# Patient Record
Sex: Female | Born: 1975
Health system: Southern US, Community
[De-identification: ages and names within clinical notes are randomized; demographics above are authoritative.]

## PROBLEM LIST (undated history)

## (undated) DIAGNOSIS — F419 Anxiety disorder, unspecified: Secondary | ICD-10-CM

## (undated) DIAGNOSIS — F329 Major depressive disorder, single episode, unspecified: Secondary | ICD-10-CM

## (undated) DIAGNOSIS — F32A Depression, unspecified: Secondary | ICD-10-CM

## (undated) DIAGNOSIS — D649 Anemia, unspecified: Secondary | ICD-10-CM

## (undated) HISTORY — DX: Anxiety disorder, unspecified: F41.9

## (undated) HISTORY — PX: TUBAL LIGATION: SHX77

## (undated) HISTORY — DX: Major depressive disorder, single episode, unspecified: F32.9

## (undated) HISTORY — DX: Depression, unspecified: F32.A

## (undated) HISTORY — DX: Anemia, unspecified: D64.9

---

## 2009-12-19 ENCOUNTER — Emergency Department (HOSPITAL_BASED_OUTPATIENT_CLINIC_OR_DEPARTMENT_OTHER): Admission: EM | Admit: 2009-12-19 | Discharge: 2009-12-19 | Payer: Self-pay | Admitting: Emergency Medicine

## 2010-04-26 LAB — URINE MICROSCOPIC-ADD ON

## 2010-04-26 LAB — CBC
HCT: 35 % — ABNORMAL LOW (ref 36.0–46.0)
Hemoglobin: 11.7 g/dL — ABNORMAL LOW (ref 12.0–15.0)
MCH: 30.5 pg (ref 26.0–34.0)
MCHC: 33.4 g/dL (ref 30.0–36.0)
RDW: 12.2 % (ref 11.5–15.5)

## 2010-04-26 LAB — URINALYSIS, ROUTINE W REFLEX MICROSCOPIC
Bilirubin Urine: NEGATIVE
Ketones, ur: 15 mg/dL — AB
Leukocytes, UA: NEGATIVE
Nitrite: NEGATIVE
Protein, ur: 30 mg/dL — AB

## 2010-04-26 LAB — COMPREHENSIVE METABOLIC PANEL
ALT: 8 U/L (ref 0–35)
AST: 29 U/L (ref 0–37)
Alkaline Phosphatase: 91 U/L (ref 39–117)
CO2: 22 mEq/L (ref 19–32)
Calcium: 9.7 mg/dL (ref 8.4–10.5)
GFR calc Af Amer: >60 mL/min (ref >60)
Glucose, Bld: 77 mg/dL (ref 70–99)
Potassium: 4.1 mEq/L (ref 3.5–5.1)
Sodium: 143 mEq/L (ref 135–145)
Total Protein: 8.7 g/dL — ABNORMAL HIGH (ref 6.0–8.3)

## 2010-04-26 LAB — LIPASE, BLOOD: Lipase: 24 U/L (ref 23–300)

## 2010-04-26 LAB — DIFFERENTIAL
Basophils Absolute: 0.1 10*3/uL (ref 0.0–0.1)
Basophils Relative: 1 % (ref 0–1)
Eosinophils Absolute: 0 10*3/uL (ref 0.0–0.7)
Eosinophils Relative: 0 % (ref 0–5)
Monocytes Absolute: 0.4 10*3/uL (ref 0.1–1.0)
Monocytes Relative: 4 % (ref 3–12)

## 2010-04-26 LAB — PREGNANCY, URINE: Preg Test, Ur: NEGATIVE

## 2011-05-25 ENCOUNTER — Other Ambulatory Visit (HOSPITAL_COMMUNITY)
Admission: RE | Admit: 2011-05-25 | Discharge: 2011-05-25 | Disposition: A | Discharge status: Home / Self Care | Payer: BC Managed Care – PPO | Source: Ambulatory Visit | Attending: Family Medicine | Admitting: Family Medicine

## 2011-05-25 ENCOUNTER — Other Ambulatory Visit: Payer: Self-pay

## 2011-05-25 DIAGNOSIS — Z01419 Encounter for gynecological examination (general) (routine) without abnormal findings: Secondary | ICD-10-CM | POA: Insufficient documentation

## 2011-10-31 ENCOUNTER — Other Ambulatory Visit: Payer: Self-pay | Admitting: Family Medicine

## 2011-10-31 DIAGNOSIS — E010 Iodine-deficiency related diffuse (endemic) goiter: Secondary | ICD-10-CM

## 2011-11-03 ENCOUNTER — Ambulatory Visit
Admission: RE | Admit: 2011-11-03 | Discharge: 2011-11-03 | Disposition: A | Discharge status: Home / Self Care | Payer: BC Managed Care – PPO | Source: Ambulatory Visit | Attending: Family Medicine | Admitting: Family Medicine

## 2011-11-03 DIAGNOSIS — E010 Iodine-deficiency related diffuse (endemic) goiter: Secondary | ICD-10-CM

## 2011-11-06 ENCOUNTER — Other Ambulatory Visit: Payer: BC Managed Care – PPO

## 2013-06-09 ENCOUNTER — Encounter: Payer: Self-pay | Admitting: *Deleted

## 2013-06-10 ENCOUNTER — Encounter (INDEPENDENT_AMBULATORY_CARE_PROVIDER_SITE_OTHER): Payer: Self-pay

## 2013-06-10 ENCOUNTER — Encounter: Payer: Self-pay | Admitting: Neurology

## 2013-06-10 ENCOUNTER — Ambulatory Visit (INDEPENDENT_AMBULATORY_CARE_PROVIDER_SITE_OTHER): Payer: BC Managed Care – PPO | Admitting: Neurology

## 2013-06-10 VITALS — BP 117/74 | HR 80 | Ht 65.75 in | Wt 231.0 lb

## 2013-06-10 DIAGNOSIS — R519 Headache, unspecified: Secondary | ICD-10-CM

## 2013-06-10 DIAGNOSIS — R51 Headache: Secondary | ICD-10-CM

## 2013-06-10 DIAGNOSIS — G43909 Migraine, unspecified, not intractable, without status migrainosus: Secondary | ICD-10-CM

## 2013-06-10 MED ORDER — AMITRIPTYLINE HCL 25 MG PO TABS: 12.5000 mg | ORAL_TABLET | Freq: Every day | ORAL | Status: DC

## 2013-06-10 NOTE — Patient Instructions (Signed)
Overall you are doing fairly well but I do want to suggest a few things today:   Remember to drink plenty of fluid, eat healthy meals and do not skip any meals. Try to eat protein with a every meal and eat a healthy snack such as fruit or nuts in between meals. Try to keep a regular sleep-wake schedule and try to exercise daily, particularly in the form of walking, 20-30 minutes a day, if you can.   As far as your medications are concerned, I would like to suggest the following: 1)please start taking Elavil 12.5mg  nightly (1/2 tablet). The main side effect is fatigue and dry mouth  I would like to see you back in 4 months, sooner if we need to. Please call us with any interim questions, concerns, problems, updates or refill requests.   My clinical assistant and will answer any of your questions and relay your messages to me and also relay most of my messages to you.   Our phone number is [phone]. We also have an after hours call service for urgent matters and there is a physician on-call for urgent questions. For any emergencies you know to call 911 or go to the nearest emergency room

## 2013-06-10 NOTE — Progress Notes (Signed)
[facility]    Provider:  Dr Hosie PoissonSumner Referring Provider: Cain SaupeFulp, Cammie, MD Primary Care Physician:  Cain SaupeFULP, CAMMIE, MD  CC:  headaches  HPI:  Angel Mueller is a 38 y.o. female here as a referral from Dr. Jillyn HiddenFulp for evaluation of chronic headaches  Reports chronic headache history, occuring near daily. Has had headaches since childhood. Can last 6 hours to all day if severe. Typically on the left temporal region, behind the eye. Described as a squeezing, pulsating pain. At its worst can get up to a 10/10, typically a 6/10 on a daily basis. Will have 4-6 severe exacerbations a month. + Nausea, no emesis, + photo and phonophobia. No vision change or vision loss, no transient obscurations. No focal motor or sensory changes. Notes some light headed sensations with the headache. Can triggered by humidity, cold cuts, red wine, tomatoes. Has tried Imitrex, bultabital in the past with no benefit. Currently taking Maxalt as needed and topamax, but unable to tolerate topamax due to side effects of paresthesias so she stopped it. In the past has tried nasal imitrex. Has not been on any other daily agents. Otherwise healthy. Has some difficulty sleeping. No known snoring or apnea events.   Strong family history of headaches.   Review of Systems: Out of a complete 14 system review, the patient complains of only the following symptoms, and all other reviewed systems are negative. + headaches, numbness, anxiety, fatigue  History   Social History  . Marital Status: Married    Spouse Name: N/A    Number of Children: 1  . Years of Education: Masters   Occupational History  .     Social History Main Topics  . Smoking status: Never Smoker   . Smokeless tobacco: Not on file  . Alcohol Use: Yes     Comment: 1-2 per week occ, wine  . Drug Use: No  . Sexual Activity: Not on file   Other Topics Concern  . Not on file   Social History Narrative   Married, 1 child   Right handed   Masters degree   No caffeine    Family History  Problem Relation Age of Onset  . Cancer - Other Maternal Aunt   . Ovarian cancer Maternal Grandmother   . Cancer Maternal Grandfather   . Prostate cancer Paternal Grandfather     No past medical history on file.  No past surgical history on file.  Current Outpatient Prescriptions  Medication Sig Dispense Refill  . ibuprofen (ADVIL,MOTRIN) 800 MG tablet Take 800 mg by mouth every 8 (eight) hours as needed.      . loratadine (CLARITIN) 10 MG tablet Take 10 mg by mouth daily.      . medroxyPROGESTERone (PROVERA) 10 MG tablet Take 10 mg by mouth daily.      . rizatriptan (MAXALT-MLT) 10 MG disintegrating tablet       . valACYclovir (VALTREX) 500 MG tablet Take 500 mg by mouth 2 (two) times daily.       No current facility-administered medications for this visit.    Allergies as of 06/10/2013 - Review Complete 06/10/2013  Allergen Reaction Noted  . Sulfa antibiotics Anaphylaxis 06/09/2013    Vitals: BP 117/74  Pulse 80  Ht 5' 5.75" (1.67 m)  Wt 231 lb (104.781 kg)  BMI 37.57 kg/m2 Last Weight:  Wt Readings from Last 1 Encounters:  06/10/13 231 lb (104.781 kg)   Last Height:   Ht Readings from Last 1 Encounters:  06/10/13  5' 5.75" (1.67 m)     Physical exam: Exam: Gen: NAD, conversant Eyes: anicteric sclerae, moist conjunctivae HENT: Atraumatic, oropharynx clear Neck: Trachea midline; supple,  Lungs: CTA, no wheezing, rales, rhonic                          CV: RRR, no MRG Abdomen: Soft, non-tender;  Extremities: No peripheral edema  Skin: Normal temperature, no rash,  Psych: Appropriate affect, pleasant  Neuro: MS: AA&Ox3, appropriately interactive, normal affect   Attention: WORLD backwards  Speech: fluent w/o paraphasic error  Memory: good recent and remote recall  CN: PERRL, EOMI no nystagmus, fundoscopic exam wnl, VFF to FC bilat, no ptosis, sensation intact to LT V1-V3 bilat, face symmetric, no  weakness, hearing grossly intact, palate elevates symmetrically, shoulder shrug 5/5 bilat,  tongue protrudes midline, no fasiculations noted.  Motor: normal bulk and tone Strength: 5/5  In all extremities  Coord: rapid alternating and point-to-point (FNF, HTS) movements intact.  Reflexes: symmetrical, bilat downgoing toes  Sens: LT intact in all extremities  Gait: posture, stance, stride and arm-swing normal. Tandem gait intact. Able to walk on heels and toes. Romberg absent.   Assessment:  After physical and neurologic examination, review of laboratory studies, imaging, neurophysiology testing and pre-existing records, assessment will be reviewed on the problem list.  Plan:  Treatment plan and additional workup will be reviewed under Problem List.  1)Migraine headache  37y/o woman presenting for initial evaluation of chronic headache which based on history is most consistent with a diagnosis of migraine. She has tried Topamax in the past but was unable to tolerate. Currently taking Rizatriptan as needed for headache relief. Will try daily treatment with Elavil 12.5mg  nightly, counseled patient on potential side effects. If no improvement can consider increasing the dose. Would consider Botox therapy for chronic migraine in the future.   Elspeth ChoPeter Brason Berthelot, DO  [facility] [address] [place], [place] [id number]  Phone [phone] Fax [phone]

## 2013-10-09 ENCOUNTER — Ambulatory Visit: Payer: BC Managed Care – PPO | Admitting: Neurology

## 2013-12-12 ENCOUNTER — Telehealth: Payer: Self-pay | Admitting: Neurology

## 2013-12-12 NOTE — Telephone Encounter (Signed)
Attempted to call patient regarding rescheduling 10/09/13 appointment, patient is former Dr. Hosie PoissonSumner patient. No voicemail option on contact number.

## 2017-01-12 DIAGNOSIS — R252 Cramp and spasm: Secondary | ICD-10-CM | POA: Diagnosis not present

## 2017-07-20 LAB — BASIC METABOLIC PANEL
Creatinine: 0.7 (ref 0.5–1.1)
GLUCOSE: 93

## 2017-07-20 LAB — HEMOGLOBIN A1C: Hemoglobin A1C: 4.9

## 2017-07-20 LAB — LIPID PANEL
CHOLESTEROL: 147 (ref 0–200)
HDL: 73 — AB (ref 35–70)
LDL CALC: 63
Triglycerides: 54 (ref 40–160)

## 2017-09-19 ENCOUNTER — Encounter: Payer: Self-pay | Admitting: Family Medicine

## 2017-09-19 ENCOUNTER — Other Ambulatory Visit: Payer: Self-pay

## 2017-09-19 ENCOUNTER — Ambulatory Visit: Payer: BLUE CROSS/BLUE SHIELD | Admitting: Family Medicine

## 2017-09-19 VITALS — BP 123/83 | HR 76 | Temp 98.5°F | Resp 17 | Ht 65.75 in | Wt 237.4 lb

## 2017-09-19 DIAGNOSIS — E6609 Other obesity due to excess calories: Secondary | ICD-10-CM | POA: Diagnosis not present

## 2017-09-19 DIAGNOSIS — Z6838 Body mass index (BMI) 38.0-38.9, adult: Secondary | ICD-10-CM | POA: Diagnosis not present

## 2017-09-19 DIAGNOSIS — G43709 Chronic migraine without aura, not intractable, without status migrainosus: Secondary | ICD-10-CM | POA: Diagnosis not present

## 2017-09-19 NOTE — Progress Notes (Signed)
Chief Complaint  Patient presents with  . New Patient (Initial Visit)    establish care, no concern, per pt bloodwork (cbc and cholest) checked for work on last month    HPI  She is here to established care.  She has a history of migraines, anxiety and depression and irregular periods She stats that she is not currently on any medicaitons Her Primary Care Doctor left practice and she neeed a new provider She states that she now uses ibuprofen for her migraines  She reports that she gets migraines 1-2 times a week and last 1-2 days She states that is she can take the ibuprofen 800mg  early enough they are better  She does not get aura but gets nausea [name] was 08/01/2017.   Anxiety and depression She is not on any medications for depression or anxiety at this time Depression screen [facility] 2/9 09/19/2017  Decreased Interest 0  Down, Depressed, Hopeless 0  PHQ - 2 Score 0   Obesity She reports that she is plateua in her weight She is not from [place] so since moving here she gained but has stabilized her weight She is not exercising She eats 3 meals a day and has healthy snacks She is sedentary    Past Medical History:  Diagnosis Date  . Anemia   . Anxiety   . Depression     Current Outpatient Medications  Medication Sig Dispense Refill  . ibuprofen (ADVIL,MOTRIN) 800 MG tablet Take 800 mg by mouth every 8 (eight) hours as needed.     No current facility-administered medications for this visit.     Allergies:  Allergies  Allergen Reactions  . Sulfa Antibiotics Anaphylaxis    History reviewed. No pertinent surgical history.  Social History   Socioeconomic History  . Marital status: Married    Spouse name: Not on file  . Number of children: 1  . Years of education: Masters  . Highest education level: Not on file  Occupational History    Employer: [employer]  Social Needs  . Financial resource strain: Not on file  . Food insecurity:     Worry: Not on file    Inability: Not on file  . Transportation needs:    Medical: Not on file    Non-medical: Not on file  Tobacco Use  . Smoking status: Never Smoker  . Smokeless tobacco: Never Used  Substance and Sexual Activity  . Alcohol use: Yes    Comment: 1-2 per week occ, wine  . Drug use: No  . Sexual activity: Not on file  Lifestyle  . Physical activity:    Days per week: Not on file    Minutes per session: Not on file  . Stress: Not on file  Relationships  . Social connections:    Talks on phone: Not on file    Gets together: Not on file    Attends religious service: Not on file    Active member of club or organization: Not on file    Attends meetings of clubs or organizations: Not on file    Relationship status: Not on file  Other Topics Concern  . Not on file  Social History Narrative   Married, 1 child   Right handed   Masters degree   No caffeine    Family History  Problem Relation Age of Onset  . Heart disease Father   . Prostate cancer Paternal Grandfather   . Cancer Maternal Grandfather   .  Ovarian cancer Maternal Grandmother   . Cancer - Other Maternal Aunt      ROS Review of Systems See HPI Constitution: No fevers or chills No malaise No diaphoresis Skin: No rash or itching Eyes: no blurry vision, no double vision GU: no dysuria or hematuria Neuro: no dizziness or headaches all others reviewed and negative   Objective: Vitals:   09/19/17 1540  BP: 123/83  Pulse: 76  Resp: 17  Temp: 98.5 F (36.9 C)  TempSrc: Oral  SpO2: 99%  Weight: 237 lb 6.4 oz (107.7 kg)  Height: 5' 5.75" (1.67 m)  Body mass index is 38.61 kg/m.   Physical Exam  Constitutional: She is oriented to [place], place, and time. She appears well-developed and well-nourished.  HENT:  Head: Normocephalic and atraumatic.  Eyes: Conjunctivae and EOM are normal.  Cardiovascular: Normal rate, regular rhythm and normal heart sounds.  No murmur  heard. Pulmonary/Chest: Effort normal and breath sounds normal. No stridor. No respiratory distress.  Neurological: She is alert and oriented to [place], place, and time.  Skin: Skin is warm. Capillary refill takes less than 2 seconds.  Psychiatric: She has a normal mood and affect. Her behavior is normal. Judgment and thought content normal.    Assessment and Plan Hilda LiasMarie was seen today for new patient (initial visit).  Diagnoses and all orders for this visit:  Class 2 obesity due to excess calories without serious comorbidity with body mass index (BMI) of 38.0 to 38.9 in adult For her obesity will check thyroid function, diabetes and lipid Will review [facility] biometric results Discussed exercise strategies   Chronic migraine without aura without status migrainosus, not intractable Chronic Fatigue Will check magnesium, vitamin D, b12, iron panel and cbc for fatigue symptoms Will also screen for sleep apnea   Kylah Maresh A Creta LevinStallings

## 2017-09-19 NOTE — Patient Instructions (Addendum)
IF you received an x-ray today, you will receive an invoice from [facility]. Please contact [facility] at [phone] with questions or concerns regarding your invoice.   IF you received labwork today, you will receive an invoice from [facility]. Please contact [facility] at [phone] with questions or concerns regarding your invoice.   Our billing staff will not be able to assist you with questions regarding bills from these companies.  You will be contacted with the lab results as soon as they are available. The fastest way to get your results is to activate your My Chart account. Instructions are located on the last page of this paperwork. If you have not heard from us regarding the results in 2 weeks, please contact this office.   We recommend that you schedule a mammogram for breast cancer screening. Typically, you do not need a referral to do this. Please contact a local imaging center to schedule your mammogram.  [facility] - [phone]) [phone]  *ask for the Radiology Department [facility]) - [phone] or [phone]) [phone]  [facility] - [phone] [facility] - [phone]) [phone] [facility] - [phone]) [phone]  *ask for the Radiology Department [facility] - [phone]  *ask for the Radiology Department [facility] - [phone]) [phone]  *ask for the Mammography Department [facility] - [phone]) [phone]  Exercising to Lose Weight Exercising can help you to lose weight. In order to lose weight through exercise, you need to do vigorous-intensity exercise. You can tell that you are exercising with vigorous intensity if you are breathing very hard and fast and cannot hold a conversation while exercising. Moderate-intensity exercise helps to maintain your current weight. You can tell that you are exercising at a moderate level if you have a higher heart rate and faster  breathing, but you are still able to hold a conversation. How often should I exercise? Choose an activity that you enjoy and set realistic goals. Your health care provider can help you to make an activity plan that works for you. Exercise regularly as directed by your health care provider. This may include:  Doing resistance training twice each week, such as: ? Push-ups. ? Sit-ups. ? Lifting weights. ? Using resistance bands.  Doing a given intensity of exercise for a given amount of time. Choose from these options: ? 150 minutes of moderate-intensity exercise every week. ? 75 minutes of vigorous-intensity exercise every week. ? A mix of moderate-intensity and vigorous-intensity exercise every week.  Children, pregnant women, people who are out of shape, people who are overweight, and older adults may need to consult a health care provider for individual recommendations. If you have any sort of medical condition, be sure to consult your health care provider before starting a new exercise program. What are some activities that can help me to lose weight?  Walking at a rate of at least 4.5 miles an hour.  Jogging or running at a rate of 5 miles per hour.  Biking at a rate of at least 10 miles per hour.  Lap swimming.  Roller-skating or in-line skating.  Cross-country skiing.  Vigorous competitive sports, such as football, basketball, and soccer.  Jumping rope.  Aerobic dancing. How can I be more active in my day-to-day activities?  Use the stairs instead of the elevator.  Take a walk during your lunch break.  If you drive, park your car farther away from work or school.  If you take public transportation, get off one stop early and walk the rest of the way.  Make all of your phone calls while standing up and walking around.  Get up, stretch, and walk around every 30 minutes throughout the day. What guidelines should I follow while exercising?  Do not exercise so much  that you hurt yourself, feel dizzy, or get very short of breath.  Consult your health care provider prior to starting a new exercise program.  Wear comfortable clothes and shoes with good support.  Drink plenty of water while you exercise to prevent dehydration or heat stroke. Body water is lost during exercise and must be replaced.  Work out until you breathe faster and your heart beats faster. This information is not intended to replace advice given to you by your health care provider. Make sure you discuss any questions you have with your health care provider. Document Released: 03/04/2010 Document Revised: 07/08/2015 Document Reviewed: 07/03/2013 Elsevier Interactive Patient Education  [employer].

## 2017-09-20 ENCOUNTER — Other Ambulatory Visit: Payer: Self-pay | Admitting: Family Medicine

## 2017-09-20 DIAGNOSIS — Z1231 Encounter for screening mammogram for malignant neoplasm of breast: Secondary | ICD-10-CM

## 2017-10-10 ENCOUNTER — Encounter: Payer: BLUE CROSS/BLUE SHIELD | Admitting: Family Medicine

## 2017-10-22 ENCOUNTER — Encounter: Payer: BLUE CROSS/BLUE SHIELD | Admitting: Family Medicine

## 2017-10-22 ENCOUNTER — Ambulatory Visit
Admission: RE | Admit: 2017-10-22 | Discharge: 2017-10-22 | Disposition: A | Discharge status: Home / Self Care | Payer: BLUE CROSS/BLUE SHIELD | Source: Ambulatory Visit | Attending: Family Medicine | Admitting: Family Medicine

## 2017-10-22 DIAGNOSIS — Z1231 Encounter for screening mammogram for malignant neoplasm of breast: Secondary | ICD-10-CM

## 2017-10-23 ENCOUNTER — Ambulatory Visit (INDEPENDENT_AMBULATORY_CARE_PROVIDER_SITE_OTHER): Payer: BLUE CROSS/BLUE SHIELD | Admitting: Family Medicine

## 2017-10-23 ENCOUNTER — Other Ambulatory Visit: Payer: Self-pay

## 2017-10-23 ENCOUNTER — Encounter: Payer: Self-pay | Admitting: Family Medicine

## 2017-10-23 VITALS — BP 107/73 | HR 76 | Temp 99.1°F | Resp 16 | Ht 65.47 in | Wt 230.0 lb

## 2017-10-23 DIAGNOSIS — Z6837 Body mass index (BMI) 37.0-37.9, adult: Secondary | ICD-10-CM

## 2017-10-23 DIAGNOSIS — Z23 Encounter for immunization: Secondary | ICD-10-CM

## 2017-10-23 DIAGNOSIS — Z0000 Encounter for general adult medical examination without abnormal findings: Secondary | ICD-10-CM

## 2017-10-23 DIAGNOSIS — E6609 Other obesity due to excess calories: Secondary | ICD-10-CM

## 2017-10-23 NOTE — Patient Instructions (Addendum)
If you have lab work done today you will be contacted with your lab results within the next 2 weeks.  If you have not heard from [place] then please contact us. The fastest way to get your results is to register for My Chart.   IF you received an x-ray today, you will receive an invoice from [facility]. Please contact [facility] at [phone] with questions or concerns regarding your invoice.   IF you received labwork today, you will receive an invoice from [place]. Please contact [facility] at [phone] with questions or concerns regarding your invoice.   Our billing staff will not be able to assist you with questions regarding bills from these companies.  You will be contacted with the lab results as soon as they are available. The fastest way to get your results is to activate your My Chart account. Instructions are located on the last page of this paperwork. If you have not heard from [place] regarding the results in 2 weeks, please contact this office.    MMR (Measles, Mumps and Rubella) Vaccine: What You Need to Know 1. Why get vaccinated? Measles, mumps, and rubella are viral diseases that can have serious consequences. Before vaccines, these diseases were very common in the [place], especially among children. They are still common in many parts of the world. Measles  Measles virus causes symptoms that can include fever, cough, runny nose, and red, watery eyes, commonly followed by a rash that covers the whole body.  Measles can lead to ear infections, diarrhea, and infection of the lungs (pneumonia). Rarely, measles can cause brain damage or death. Mumps  Mumps virus causes fever, headache, muscle aches, tiredness, loss of appetite, and swollen and tender salivary glands under the ears on one or both sides.  Mumps can lead to deafness, swelling of the brain and/or spinal cord covering (encephalitis or meningitis), painful swelling of the testicles or  ovaries, and, very rarely, death. Rubella (also known as [place] Measles)  Rubella virus causes fever, sore throat, rash, headache, and eye irritation.  Rubella can cause arthritis in up to half of teenage and adult women.  If a woman gets rubella while she is pregnant, she could have a miscarriage or her baby could be born with serious birth defects. These diseases can easily spread from [place] to [place]. Measles doesn't even require personal contact. You can get measles by entering a room that a Reger with measles left up to 2 hours before. Vaccines and high rates of vaccination have made these diseases much less common in the [place]. 2. MMR vaccine Children should get 2 doses of MMR vaccine, usually:  First dose: 12 through 48 months of age  Second dose: 36 through 42 years of age  33 who will be traveling outside the [place] when they are between 73 and 39 months of age should get a dose of MMR vaccine before travel. This can provide temporary protection from measles infection, but will not give permanent immunity. The child should still get 2 doses at the recommended ages for long-lasting protection. Adults might also need MMR vaccine. Many adults 26 years of age and older might be susceptible to measles, mumps, and rubella without knowing it. A third dose of MMR might be recommended in certain mumps outbreak situations. There are no known risks to getting MMR vaccine at the same time as other vaccines. There is a combination vaccine called MMRV that contains both chickenpox and MMR vaccines.  MMRV is an option for some children 12 months through 30 years of age. There is a separate Vaccine Information Statement for MMRV. Your health care provider can give you more information. 3. Some people should not get this vaccine Tell your vaccine provider if the [place] getting the vaccine:  Has any severe, life-threatening allergies. A Ericson who has ever had a life-threatening  allergic reaction after a dose of MMR vaccine, or has a severe allergy to any part of this vaccine, may be advised not to be vaccinated. Ask your health care provider if you want information about vaccine components.  Is pregnant, or thinks she might be pregnant. Pregnant women should wait to get MMR vaccine until after they are no longer pregnant. Women should avoid getting pregnant for at least 1 month after getting MMR vaccine.  Has a weakened immune system due to disease (such as cancer or HIV/AIDS) or medical treatments (such as radiation, immunotherapy, steroids, or chemotherapy).  Has a parent, brother, or sister with a history of immune system problems.  Has ever had a condition that makes them bruise or bleed easily.  Has recently had a blood transfusion or received other blood products. You might be advised to postpone MMR vaccination for 3 months or more.  Has tuberculosis.  Has gotten any other vaccines in the past 4 weeks. Live vaccines given too close together might not work as well.  Is not feeling well. A mild illness, such as a cold, is usually not a reason to postpone a vaccination. Someone who is moderately or severely ill should probably wait. Your doctor can advise you.  4. Risks of a vaccine reaction With any medicine, including vaccines, there is a chance of reactions. These are usually mild and go away on their own, but serious reactions are also possible. Getting MMR vaccine is much safer than getting measles, mumps, or rubella disease. Most people who get MMR vaccine do not have any problems with it. After MMR vaccination, a [place] might experience: Minor events:  Sore arm from the injection  Fever  Redness or rash at the injection site  Swelling of glands in the cheeks or neck If these events happen, they usually begin within 2 weeks after the shot. They occur less often after the second dose. Moderate events:  Seizure (jerking or staring) often associated  with fever  Temporary pain and stiffness in the joints, mostly in teenage or adult women  Temporary low platelet count, which can cause unusual bleeding or bruising  Rash all over body Severe events occur very rarely:  Deafness  Long-term seizures, coma, or lowered consciousness  Brain damage Other things that could happen after this vaccine:  People sometimes faint after medical procedures, including vaccination. Sitting or lying down for about 15 minutes can help prevent fainting and injuries caused by a fall. Tell your provider if you feel dizzy or have vision changes or ringing in the ears.  Some people get shoulder pain that can be more severe and longer-lasting than routine soreness that can follow injections. This happens very rarely.  Any medication can cause a severe allergic reaction. Such reactions to a vaccine are estimated at about 1 in a million doses, and would happen within a few minutes to a few hours after the vaccination. As with any medicine, there is a very remote chance of a vaccine causing a serious injury or death. The safety of vaccines is always being monitored. For more information, visit: http://www.aguilar.org/ 5. What if  there is a serious problem? What should I look for?  Look for anything that concerns you, such as signs of a severe allergic reaction, very high fever, or unusual behavior. Signs of a severe allergic reaction can include hives, swelling of the face and throat, difficulty breathing, a fast heartbeat, dizziness, and weakness. These would usually start a few minutes to a few hours after the vaccination. What should I do?  If you think it is a severe allergic reaction or other emergency that can't wait, call 9-1-1 and get to the nearest hospital. Otherwise, call your health care provider.  Afterward, the reaction should be reported to the Vaccine Adverse Event Reporting System (VAERS). Your doctor should file this report, or you can do it  yourself through the VAERS web site at www.vaers.SamedayNews.es, or by calling [phone]. ? VAERS does not give medical advice. 6. The National Vaccine Injury Compensation Program The [employer] Vaccine Injury Compensation Program (VICP) is a federal program that was created to compensate people who may have been injured by certain vaccines. Persons who believe they may have been injured by a vaccine can learn about the program and about filing a claim by calling 838 681 0099 or visiting the [place] website at GoldCloset.com.ee. There is a time limit to file a claim for compensation. 7. How can I learn more?  Ask your healthcare provider. He or she can give you the vaccine package insert or suggest other sources of information.  Call your local or state health department.  Contact the Centers for Disease Control and Prevention (CDC): ? Call 802-136-2998 (1-800-CDC-INFO)  or ? Visit CDC's website at http://hunter.com/ CDC Vaccine Information Statement (VIS) MMR Vaccine (03/27/2016) This information is not intended to replace advice given to you by your health care provider. Make sure you discuss any questions you have with your health care provider. Document Released: 11/27/2005 Document Revised: 04/11/2016 Document Reviewed: 04/11/2016 Elsevier Interactive Patient Education  [employer].

## 2017-10-23 NOTE — Progress Notes (Signed)
Chief Complaint  Patient presents with  . Annual Exam    cpe with pap    Subjective:  Angel Mueller is a 42 y.o. female here for a health maintenance visit.  Patient is established pt   Patient had biometrics done June 2019 and brought her labs in today Mammogram done yesterday Has pap smear arranged with Gynecology  Patient Active Problem List   Diagnosis Date Noted  . Migraine 06/10/2013    Past Medical History:  Diagnosis Date  . Anemia   . Anxiety   . Depression     No past surgical history on file.   Outpatient Medications Prior to Visit  Medication Sig Dispense Refill  . ibuprofen (ADVIL,MOTRIN) 800 MG tablet Take 800 mg by mouth every 8 (eight) hours as needed.     No facility-administered medications prior to visit.     Allergies  Allergen Reactions  . Sulfa Antibiotics Anaphylaxis     Family History  Problem Relation Age of Onset  . Heart disease Father   . Prostate cancer Paternal Grandfather   . Cancer Maternal Grandfather   . Ovarian cancer Maternal Grandmother   . Cancer - Other Maternal Aunt      Health Habits: Dental Exam: up to date Eye Exam: up to date Exercise: 3 times/week on average Current exercise activities: walking and house work Diet: balanced   Social History   Socioeconomic History  . Marital status: Married    Spouse name: Not on file  . Number of children: 1  . Years of education: Masters  . Highest education level: Not on file  Occupational History    Employer: [employer]  . Financial resource strain: Not on file  . Food insecurity:    Worry: Not on file    Inability: Not on file  . Transportation needs:    Medical: Not on file    Non-medical: Not on file  Tobacco Use  . Smoking status: Never Smoker  . Smokeless tobacco: Never Used  Substance and Sexual Activity  . Alcohol use: Yes    Comment: 1-2 per week occ, wine  . Drug use: No  . Sexual activity: Not on file  Lifestyle  .  Physical activity:    Days per week: Not on file    Minutes per session: Not on file  . Stress: Not on file  Relationships  . Social connections:    Talks on phone: Not on file    Gets together: Not on file    Attends religious service: Not on file    Active member of club or organization: Not on file    Attends meetings of clubs or organizations: Not on file    Relationship status: Not on file  . Intimate partner violence:    Fear of current or ex partner: Not on file    Emotionally abused: Not on file    Physically abused: Not on file    Forced sexual activity: Not on file  Other Topics Concern  . Not on file  Social History Narrative   Married, 1 child   Right handed   Masters degree   No caffeine   Social History   Substance and Sexual Activity  Alcohol Use Yes   Comment: 1-2 per week occ, wine   Social History   Tobacco Use  Smoking Status Never Smoker  Smokeless Tobacco Never Used   Social History   Substance and Sexual Activity  Drug Use  No    GYN: Sexual Health Menstrual status: regular menses LMP: [name] was 09/22/2017. Last pap smear: see HM section History of abnormal pap smears:    Health Maintenance: See under health Maintenance activity for review of completion dates as well. There is no immunization history for the selected administration types on file for this patient.    Depression Screen-PHQ2/9 Depression screen Angel Mueller 2/9 10/23/2017 09/19/2017  Decreased Interest 0 0  Down, Depressed, Hopeless 0 0  PHQ - 2 Score 0 0       Depression Severity and Treatment Recommendations:  0-4= None  5-9= Mild / Treatment: Support, educate to call if worse; return in one month  10-14= Moderate / Treatment: Support, watchful waiting; Antidepressant or Psycotherapy  15-19= Moderately severe / Treatment: Antidepressant OR Psychotherapy  >= 20 = Major depression, severe / Antidepressant AND Psychotherapy    Review of Systems    Review of Systems  Constitutional: Negative for chills and fever.  Respiratory: Negative for cough, shortness of breath and wheezing.   Cardiovascular: Negative for chest pain, palpitations and orthopnea.  Gastrointestinal: Negative for abdominal pain, constipation, diarrhea, nausea and vomiting.  Genitourinary: Negative for dysuria, frequency and urgency.  Musculoskeletal: Negative for myalgias and neck pain.  Skin: Negative for itching and rash.  Neurological: Negative for dizziness, tingling and headaches.  Psychiatric/Behavioral: Negative for depression. The patient is not nervous/anxious.       Objective:   Vitals:   10/23/17 0918  BP: 107/73  Pulse: 76  Resp: 16  Temp: 99.1 F (37.3 C)  TempSrc: Oral  SpO2: 99%  Weight: 230 lb (104.3 kg)  Height: 5' 5.47" (1.663 m)   Wt Readings from Last 3 Encounters:  10/23/17 230 lb (104.3 kg)  09/19/17 237 lb 6.4 oz (107.7 kg)  06/10/13 231 lb (104.8 kg)    Body mass index is 37.72 kg/m.  Physical Exam  Constitutional: She is oriented to Angel Mueller, place, and time. She appears well-developed and well-nourished.  HENT:  Head: Normocephalic and atraumatic.  Right Ear: External ear normal.  Left Ear: External ear normal.  Mouth/Throat: Oropharynx is clear and moist.  Eyes: Conjunctivae and EOM are normal.  Neck: Normal range of motion. Neck supple. No thyromegaly present.  Cardiovascular: Normal rate, regular rhythm and normal heart sounds.  No murmur heard. Pulmonary/Chest: Effort normal and breath sounds normal. No stridor. No respiratory distress. She has no wheezes. She has no rales. Right breast exhibits no inverted nipple, no mass, no nipple discharge, no skin change and no tenderness. Left breast exhibits no inverted nipple, no mass, no nipple discharge, no skin change and no tenderness. No breast swelling, tenderness, discharge or bleeding. Breasts are symmetrical.  Abdominal: Soft. Bowel sounds are normal. She exhibits  no distension. There is no tenderness. There is no guarding.  Musculoskeletal: Normal range of motion. She exhibits no edema or deformity.  Neurological: She is alert and oriented to Angel Mueller, place, and time.  Skin: Skin is warm. Capillary refill takes less than 2 seconds.  Psychiatric: She has a normal mood and affect. Her behavior is normal. Judgment and thought content normal.       Assessment/Plan:   Patient was seen for a health maintenance exam.  Counseled the patient on health maintenance issues. Reviewed her health mainteance schedule and ordered appropriate tests (see orders.) Counseled on regular exercise and weight management. Recommend regular eye exams and dental cleaning.   The following issues were addressed today for health maintenance:  Chene was seen today for annual exam.  Diagnoses and all orders for this visit:  Encounter for health maintenance examination - Women's Health Maintenance Plan Advised monthly breast exam and annual mammogram Advised dental exam every six months Discussed stress management Discussed pap smear screening guidelines   Need for MMR vaccine- pt titers shows that the is nonimmune for mumps -     MMR vaccine subcutaneous  Class 2 obesity due to excess calories without serious comorbidity with body mass index (BMI) of 37.0 to 37.9 in adult -   Improved with weight loss    Return in about 3 months (around 01/22/2018) for weight check.    Body mass index is 37.72 kg/m.:  Discussed the patient's BMI with patient. The BMI body mass index is 37.72 kg/m.     No future appointments.  Patient Instructions       If you have lab work done today you will be contacted with your lab results within the next 2 weeks.  If you have not heard from [place] then please contact us. The fastest way to get your results is to register for My Chart.   IF you received an x-ray today, you will receive an invoice from [facility]. Please  contact [facility] at [phone] with questions or concerns regarding your invoice.   IF you received labwork today, you will receive an invoice from [facility]. Please contact [facility] at [phone] with questions or concerns regarding your invoice.   Our billing staff will not be able to assist you with questions regarding bills from these companies.  You will be contacted with the lab results as soon as they are available. The fastest way to get your results is to activate your My Chart account. Instructions are located on the last page of this paperwork. If you have not heard from [place] regarding the results in 2 weeks, please contact this office.    MMR (Measles, Mumps and Rubella) Vaccine: What You Need to Know 1. Why get vaccinated? Measles, mumps, and rubella are viral diseases that can have serious consequences. Before vaccines, these diseases were very common in the [place], especially among children. They are still common in many parts of the world. Measles  Measles virus causes symptoms that can include fever, cough, runny nose, and red, watery eyes, commonly followed by a rash that covers the whole body.  Measles can lead to ear infections, diarrhea, and infection of the lungs (pneumonia). Rarely, measles can cause brain damage or death. Mumps  Mumps virus causes fever, headache, muscle aches, tiredness, loss of appetite, and swollen and tender salivary glands under the ears on one or both sides.  Mumps can lead to deafness, swelling of the brain and/or spinal cord covering (encephalitis or meningitis), painful swelling of the testicles or ovaries, and, very rarely, death. Rubella (also known as [place] Measles)  Rubella virus causes fever, sore throat, rash, headache, and eye irritation.  Rubella can cause arthritis in up to half of teenage and adult women.  If a woman gets rubella while she is pregnant, she could have a miscarriage or her baby could be born  with serious birth defects. These diseases can easily spread from [place] to [place]. Measles doesn't even require personal contact. You can get measles by entering a room that a Mantell with measles left up to 2 hours before. Vaccines and high rates of vaccination have made these diseases much less common in the [place]. 2. MMR vaccine Children should  get 2 doses of MMR vaccine, usually:  First dose: 12 through 37 months of age  Second dose: 39 through 42 years of age  15 who will be traveling outside the [place] when they are between 65 and 68 months of age should get a dose of MMR vaccine before travel. This can provide temporary protection from measles infection, but will not give permanent immunity. The child should still get 2 doses at the recommended ages for long-lasting protection. Adults might also need MMR vaccine. Many adults 62 years of age and older might be susceptible to measles, mumps, and rubella without knowing it. A third dose of MMR might be recommended in certain mumps outbreak situations. There are no known risks to getting MMR vaccine at the same time as other vaccines. There is a combination vaccine called MMRV that contains both chickenpox and MMR vaccines. MMRV is an option for some children 12 months through 29 years of age. There is a separate Vaccine Information Statement for MMRV. Your health care provider can give you more information. 3. Some people should not get this vaccine Tell your vaccine provider if the Snapp getting the vaccine:  Has any severe, life-threatening allergies. A Brave who has ever had a life-threatening allergic reaction after a dose of MMR vaccine, or has a severe allergy to any part of this vaccine, may be advised not to be vaccinated. Ask your health care provider if you want information about vaccine components.  Is pregnant, or thinks she might be pregnant. Pregnant women should wait to get MMR vaccine until after they are no  longer pregnant. Women should avoid getting pregnant for at least 1 month after getting MMR vaccine.  Has a weakened immune system due to disease (such as cancer or HIV/AIDS) or medical treatments (such as radiation, immunotherapy, steroids, or chemotherapy).  Has a parent, brother, or sister with a history of immune system problems.  Has ever had a condition that makes them bruise or bleed easily.  Has recently had a blood transfusion or received other blood products. You might be advised to postpone MMR vaccination for 3 months or more.  Has tuberculosis.  Has gotten any other vaccines in the past 4 weeks. Live vaccines given too close together might not work as well.  Is not feeling well. A mild illness, such as a cold, is usually not a reason to postpone a vaccination. Someone who is moderately or severely ill should probably wait. Your doctor can advise you.  4. Risks of a vaccine reaction With any medicine, including vaccines, there is a chance of reactions. These are usually mild and go away on their own, but serious reactions are also possible. Getting MMR vaccine is much safer than getting measles, mumps, or rubella disease. Most people who get MMR vaccine do not have any problems with it. After MMR vaccination, a Kohlman might experience: Minor events:  Sore arm from the injection  Fever  Redness or rash at the injection site  Swelling of glands in the cheeks or neck If these events happen, they usually begin within 2 weeks after the shot. They occur less often after the second dose. Moderate events:  Seizure (jerking or staring) often associated with fever  Temporary pain and stiffness in the joints, mostly in teenage or adult women  Temporary low platelet count, which can cause unusual bleeding or bruising  Rash all over body Severe events occur very rarely:  Deafness  Long-term seizures, coma, or lowered consciousness  Brain damage Other things that could  happen after this vaccine:  People sometimes faint after medical procedures, including vaccination. Sitting or lying down for about 15 minutes can help prevent fainting and injuries caused by a fall. Tell your provider if you feel dizzy or have vision changes or ringing in the ears.  Some people get shoulder pain that can be more severe and longer-lasting than routine soreness that can follow injections. This happens very rarely.  Any medication can cause a severe allergic reaction. Such reactions to a vaccine are estimated at about 1 in a million doses, and would happen within a few minutes to a few hours after the vaccination. As with any medicine, there is a very remote chance of a vaccine causing a serious injury or death. The safety of vaccines is always being monitored. For more information, visit: http://www.aguilar.org/ 5. What if there is a serious problem? What should I look for?  Look for anything that concerns you, such as signs of a severe allergic reaction, very high fever, or unusual behavior. Signs of a severe allergic reaction can include hives, swelling of the face and throat, difficulty breathing, a fast heartbeat, dizziness, and weakness. These would usually start a few minutes to a few hours after the vaccination. What should I do?  If you think it is a severe allergic reaction or other emergency that can't wait, call 9-1-1 and get to the nearest hospital. Otherwise, call your health care provider.  Afterward, the reaction should be reported to the Vaccine Adverse Event Reporting System (VAERS). Your doctor should file this report, or you can do it yourself through the VAERS web site at www.vaers.SamedayNews.es, or by calling (709)370-7158. ? VAERS does not give medical advice. 6. The National Vaccine Injury Compensation Program The [employer] Vaccine Injury Compensation Program (VICP) is a federal program that was created to compensate people who may have been injured by certain  vaccines. Persons who believe they may have been injured by a vaccine can learn about the program and about filing a claim by calling 262 273 5673 or visiting the [place] website at GoldCloset.com.ee. There is a time limit to file a claim for compensation. 7. How can I learn more?  Ask your healthcare provider. He or she can give you the vaccine package insert or suggest other sources of information.  Call your local or state health department.  Contact the Centers for Disease Control and Prevention (CDC): ? Call 404-032-7340 (1-800-CDC-INFO)  or ? Visit CDC's website at http://hunter.com/ CDC Vaccine Information Statement (VIS) MMR Vaccine (03/27/2016) This information is not intended to replace advice given to you by your health care provider. Make sure you discuss any questions you have with your health care provider. Document Released: 11/27/2005 Document Revised: 04/11/2016 Document Reviewed: 04/11/2016 Elsevier Interactive Patient Education  [employer].

## 2018-01-31 ENCOUNTER — Encounter: Payer: Self-pay | Admitting: Family Medicine

## 2018-01-31 NOTE — Progress Notes (Signed)
Rubella Antibodies, IgG 3.48 Rubeola Ab, IgG   34.6 Mumps Abs, IgG  >9.0

## 2018-05-03 ENCOUNTER — Other Ambulatory Visit (HOSPITAL_COMMUNITY)
Admission: RE | Admit: 2018-05-03 | Discharge: 2018-05-03 | Disposition: A | Discharge status: Home / Self Care | Payer: BLUE CROSS/BLUE SHIELD | Source: Ambulatory Visit | Attending: Obstetrics and Gynecology | Admitting: Obstetrics and Gynecology

## 2018-05-03 ENCOUNTER — Other Ambulatory Visit: Payer: Self-pay | Admitting: Obstetrics and Gynecology

## 2018-05-03 DIAGNOSIS — Z01411 Encounter for gynecological examination (general) (routine) with abnormal findings: Secondary | ICD-10-CM | POA: Diagnosis not present

## 2018-05-03 DIAGNOSIS — R102 Pelvic and perineal pain: Secondary | ICD-10-CM | POA: Diagnosis not present

## 2018-05-03 DIAGNOSIS — Z124 Encounter for screening for malignant neoplasm of cervix: Secondary | ICD-10-CM | POA: Diagnosis not present

## 2018-05-07 LAB — CYTOLOGY - PAP
Diagnosis: NEGATIVE
HPV (WINDOPATH): NOT DETECTED

## 2018-05-13 DIAGNOSIS — R102 Pelvic and perineal pain: Secondary | ICD-10-CM | POA: Diagnosis not present

## 2018-09-16 ENCOUNTER — Other Ambulatory Visit: Payer: Self-pay | Admitting: Family Medicine

## 2018-09-16 DIAGNOSIS — Z1231 Encounter for screening mammogram for malignant neoplasm of breast: Secondary | ICD-10-CM

## 2018-11-01 ENCOUNTER — Ambulatory Visit
Admission: RE | Admit: 2018-11-01 | Discharge: 2018-11-01 | Disposition: A | Discharge status: Home / Self Care | Payer: BLUE CROSS/BLUE SHIELD | Source: Ambulatory Visit | Attending: Family Medicine | Admitting: Family Medicine

## 2018-11-01 ENCOUNTER — Other Ambulatory Visit: Payer: Self-pay

## 2018-11-01 DIAGNOSIS — Z1231 Encounter for screening mammogram for malignant neoplasm of breast: Secondary | ICD-10-CM | POA: Diagnosis not present

## 2019-01-13 DIAGNOSIS — J3089 Other allergic rhinitis: Secondary | ICD-10-CM | POA: Diagnosis not present

## 2019-01-13 DIAGNOSIS — Z20828 Contact with and (suspected) exposure to other viral communicable diseases: Secondary | ICD-10-CM | POA: Diagnosis not present

## 2019-01-20 DIAGNOSIS — U071 COVID-19: Secondary | ICD-10-CM | POA: Diagnosis not present

## 2019-05-08 ENCOUNTER — Encounter (HOSPITAL_BASED_OUTPATIENT_CLINIC_OR_DEPARTMENT_OTHER): Payer: Self-pay

## 2019-05-08 ENCOUNTER — Other Ambulatory Visit: Payer: Self-pay

## 2019-05-08 ENCOUNTER — Emergency Department (HOSPITAL_BASED_OUTPATIENT_CLINIC_OR_DEPARTMENT_OTHER)
Admission: EM | Admit: 2019-05-08 | Discharge: 2019-05-08 | Disposition: A | Discharge status: Home / Self Care | Payer: BC Managed Care – PPO | Attending: Emergency Medicine | Admitting: Emergency Medicine

## 2019-05-08 DIAGNOSIS — Z882 Allergy status to sulfonamides status: Secondary | ICD-10-CM | POA: Diagnosis not present

## 2019-05-08 DIAGNOSIS — K625 Hemorrhage of anus and rectum: Secondary | ICD-10-CM | POA: Diagnosis not present

## 2019-05-08 MED ORDER — ONDANSETRON 4 MG PO TBDP
ORAL_TABLET | ORAL | Status: AC
  Administered 2019-05-08: 17:00:00 4 mg
  Filled 2019-05-08: qty 1

## 2019-05-08 MED ORDER — ONDANSETRON HCL 4 MG PO TABS
4.0000 mg | ORAL_TABLET | Freq: Once | ORAL | Status: DC
  Filled 2019-05-08: qty 1

## 2019-05-08 MED ORDER — ONDANSETRON HCL 4 MG PO TABS: 4.0000 mg | ORAL_TABLET | Freq: Four times a day (QID) | ORAL | 0 refills | Status: AC

## 2019-05-08 MED FILL — ONDANSETRON HCL 4 MG TABLET: 4 | 5 days supply | Qty: 20 | Fill #0

## 2019-05-08 NOTE — ED Triage Notes (Signed)
Pt c/o bloody stools/diarrhea-started yesterday-NAD-steady gait

## 2019-05-08 NOTE — ED Provider Notes (Signed)
[facility] EMERGENCY DEPARTMENT Provider Note   CSN: [id number] Arrival date & time: 05/08/19  1648     History Chief Complaint  Patient presents with  . Rectal Bleeding    Angel Mueller is a 44 y.o. female.  The history is provided by the patient.  Rectal Bleeding Quality: mixed into stool. Amount:  Scant Duration:  1 day Timing:  Intermittent Chronicity:  New Context: diarrhea   Similar prior episodes: no   Relieved by:  Nothing Worsened by:  Nothing Associated symptoms: no abdominal pain, no fever and no vomiting   Risk factors: no anticoagulant use, no hx of IBD, no liver disease and no NSAID use        Past Medical History:  Diagnosis Date  . Anemia   . Anxiety   . Depression     Patient Active Problem List   Diagnosis Date Noted  . Migraine 06/10/2013    Past Surgical History:  Procedure Laterality Date  . TUBAL LIGATION       OB History   No obstetric history on file.     Family History  Problem Relation Age of Onset  . Heart disease Father   . Prostate cancer Paternal Grandfather   . Cancer Maternal Grandfather   . Ovarian cancer Maternal Grandmother   . Cancer - Other Maternal Aunt     Social History   Tobacco Use  . Smoking status: Never Smoker  . Smokeless tobacco: Never Used  Substance Use Topics  . Alcohol use: Yes    Comment: occ  . Drug use: No    Home Medications Prior to Admission medications   Medication Sig Start Date End Date Taking? Authorizing Provider  ibuprofen (ADVIL,MOTRIN) 800 MG tablet Take 800 mg by mouth every 8 (eight) hours as needed.    [provider]  ondansetron (ZOFRAN) 4 MG tablet Take 1 tablet (4 mg total) by mouth every 6 (six) hours for 20 doses. 05/08/19 05/13/19  Lennice Sites, DO    Allergies    Sulfa antibiotics  Review of Systems   Review of Systems  Constitutional: Negative for chills and fever.  HENT: Negative for ear pain and sore throat.   Eyes: Negative for  pain and visual disturbance.  Respiratory: Negative for cough and shortness of breath.   Cardiovascular: Negative for chest pain and palpitations.  Gastrointestinal: Positive for blood in stool, diarrhea and hematochezia. Negative for abdominal distention, abdominal pain, constipation, nausea, rectal pain and vomiting.  Genitourinary: Negative for dysuria and hematuria.  Musculoskeletal: Negative for arthralgias and back pain.  Skin: Negative for color change and rash.  Neurological: Negative for seizures and syncope.  All other systems reviewed and are negative.   Physical Exam Updated Vital Signs  ED Triage Vitals  Enc Vitals Group     BP 05/08/19 1657 136/82     Pulse Rate 05/08/19 1657 70     Resp 05/08/19 1657 18     Temp 05/08/19 1657 98.2 F (36.8 C)     Temp Source 05/08/19 1657 Oral     SpO2 05/08/19 1657 100 %     Weight 05/08/19 1657 223 lb (101.2 kg)     Height 05/08/19 1657 5\' 5"  (1.651 m)     Head Circumference --      Peak Flow --      Pain Score 05/08/19 1655 0     Pain Loc --      Pain Edu? --  Excl. in GC? --     Physical Exam Vitals and nursing note reviewed.  Constitutional:      General: She is not in acute distress.    Appearance: She is well-developed.  HENT:     Head: Normocephalic and atraumatic.  Eyes:     Conjunctiva/sclera: Conjunctivae normal.  Cardiovascular:     Rate and Rhythm: Normal rate and regular rhythm.     Pulses: Normal pulses.     Heart sounds: No murmur.  Pulmonary:     Effort: Pulmonary effort is normal. No respiratory distress.     Breath sounds: Normal breath sounds.  Abdominal:     General: Abdomen is flat. There is no distension.     Palpations: Abdomen is soft. There is no mass.     Tenderness: There is no abdominal tenderness. There is no guarding or rebound.     Hernia: No hernia is present.  Genitourinary:    Rectum: Guaiac result negative.     Comments: No gross melena or hematochezia, no hemorrhoids, no  tenderness in the rectal vault, no fissures Musculoskeletal:     Cervical back: Neck supple.  Skin:    General: Skin is warm and dry.  Neurological:     Mental Status: She is alert.     ED Results / Procedures / Treatments   Labs (all labs ordered are listed, but only abnormal results are displayed) Labs Reviewed - No data to display  EKG None  Radiology No results found.  Procedures Procedures (including critical care time)  Medications Ordered in ED Medications  ondansetron (ZOFRAN) tablet 4 mg (has no administration in time range)    ED Course  I have reviewed the triage vital signs and the nursing notes.  Pertinent labs & imaging results that were available during my care of the patient were reviewed by me and considered in my medical decision making (see chart for details).    MDM Rules/Calculators/A&P                      Angel Mueller is a 44 year old female with no significant medical history presents the ED with blood in her stool.  Patient with normal vitals.  No fever.  Has had diarrhea for the last 2 days.  No suspicious food intake.  No recent travel.  Patient states blood intermixed with her stool for the last 2 days.  Associated with diarrhea.  Also on her menstrual cycle.  She thinks that blood is coming from her stool.  Has no gross melena or hematochezia on exam.  No hemorrhoids.  No history of inflammatory bowel disease.  No abdominal tenderness.  No fissure, no rectal tenderness.  Suspect possibly infectious colitis.  Asymptomatic overall.  No concern for severe anemia.  Recommend continue management at home with fluids, Zofran.  Told to return to the ED if worsening bleeding.  Recommend follow-up with primary care doctor symptoms persist for over a week.  This chart was dictated using voice recognition software.  Despite best efforts to proofread,  errors can occur which can change the documentation meaning.    Final Clinical Impression(s) / ED  Diagnoses Final diagnoses:  Rectal bleeding    Rx / DC Orders ED Discharge Orders         Ordered    ondansetron (ZOFRAN) 4 MG tablet  Every 6 hours     05/08/19 1712           Virgina Norfolk, DO  05/08/19 1716  

## 2019-05-08 NOTE — Discharge Instructions (Signed)
Continue to stay hydrated.  Take Zofran as needed.  Return to the ED if you have more severe bleeding as discussed.  Follow-up with your primary care doctor symptoms are not resolved in a week.

## 2019-05-09 ENCOUNTER — Telehealth: Payer: BLUE CROSS/BLUE SHIELD | Admitting: Adult Health Nurse Practitioner

## 2019-06-09 DIAGNOSIS — R5383 Other fatigue: Secondary | ICD-10-CM | POA: Diagnosis not present

## 2019-06-09 DIAGNOSIS — Z01419 Encounter for gynecological examination (general) (routine) without abnormal findings: Secondary | ICD-10-CM | POA: Diagnosis not present

## 2019-06-09 DIAGNOSIS — Z13220 Encounter for screening for lipoid disorders: Secondary | ICD-10-CM | POA: Diagnosis not present

## 2019-06-09 DIAGNOSIS — E559 Vitamin D deficiency, unspecified: Secondary | ICD-10-CM | POA: Diagnosis not present

## 2019-06-09 DIAGNOSIS — Z1329 Encounter for screening for other suspected endocrine disorder: Secondary | ICD-10-CM | POA: Diagnosis not present

## 2019-06-09 DIAGNOSIS — E669 Obesity, unspecified: Secondary | ICD-10-CM | POA: Diagnosis not present

## 2019-06-20 DIAGNOSIS — F411 Generalized anxiety disorder: Secondary | ICD-10-CM | POA: Diagnosis not present

## 2019-08-06 DIAGNOSIS — F339 Major depressive disorder, recurrent, unspecified: Secondary | ICD-10-CM | POA: Diagnosis not present

## 2019-08-06 DIAGNOSIS — F411 Generalized anxiety disorder: Secondary | ICD-10-CM | POA: Diagnosis not present

## 2019-11-12 ENCOUNTER — Other Ambulatory Visit: Payer: Self-pay | Admitting: Physician Assistant

## 2019-11-12 DIAGNOSIS — Z1231 Encounter for screening mammogram for malignant neoplasm of breast: Secondary | ICD-10-CM

## 2019-12-16 ENCOUNTER — Ambulatory Visit: Payer: BC Managed Care – PPO

## 2019-12-31 IMAGING — MG DIGITAL SCREENING BILATERAL MAMMOGRAM WITH TOMO AND CAD
8 series · 8 of 24 positions shown · non-contrast
Comparison: Previous exam(s).

CLINICAL DATA: Screening.

EXAM:
DIGITAL SCREENING BILATERAL MAMMOGRAM WITH TOMO AND CAD

[L CC synth-2D]
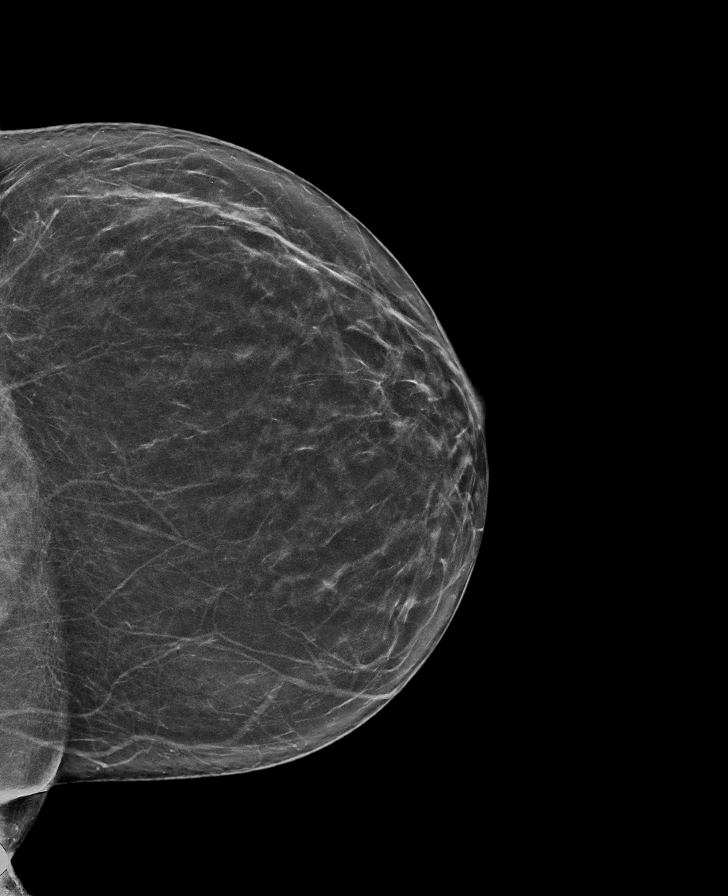

[R MLO synth-2D]
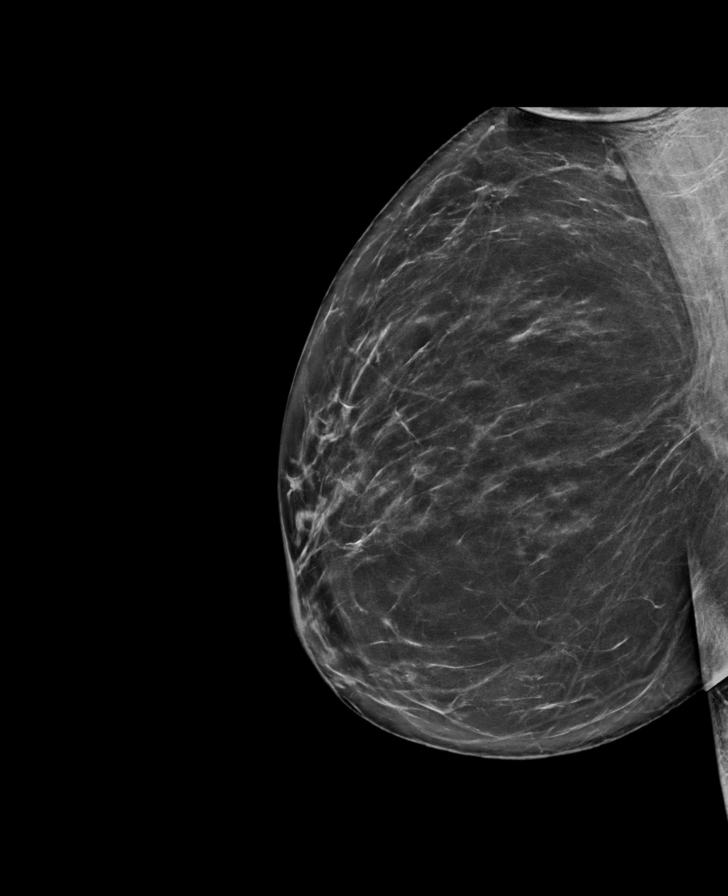

[R CC synth-2D]
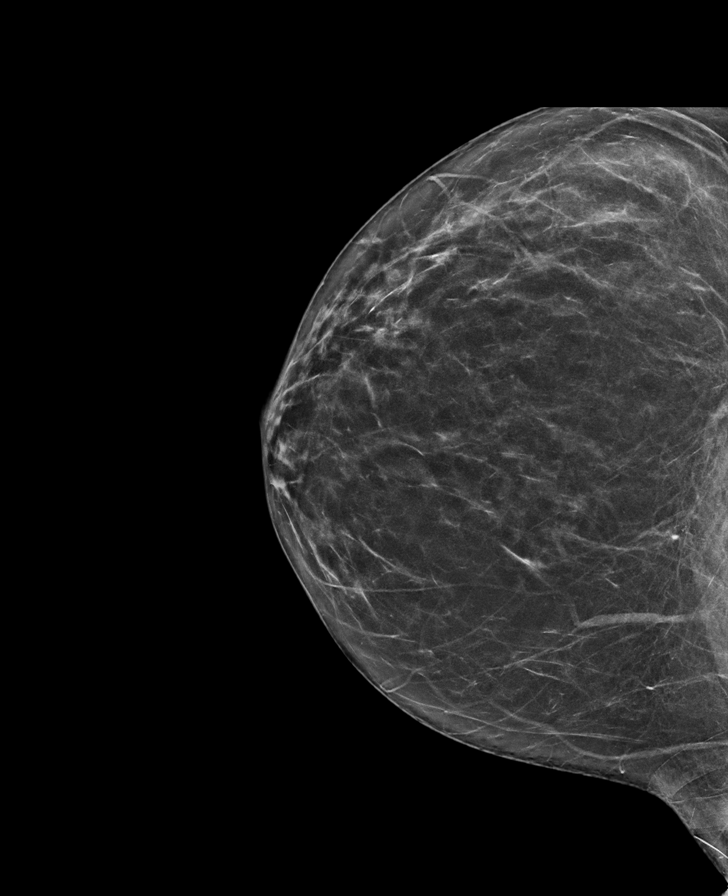

[L MLO synth-2D]
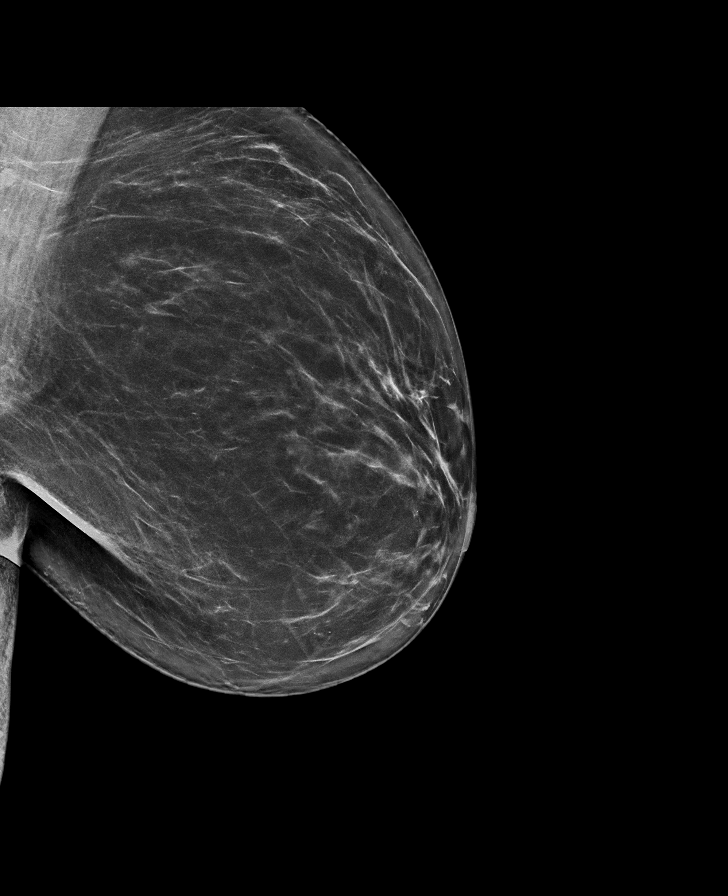

[L MLO tomo · tomo slice 40/79.0]
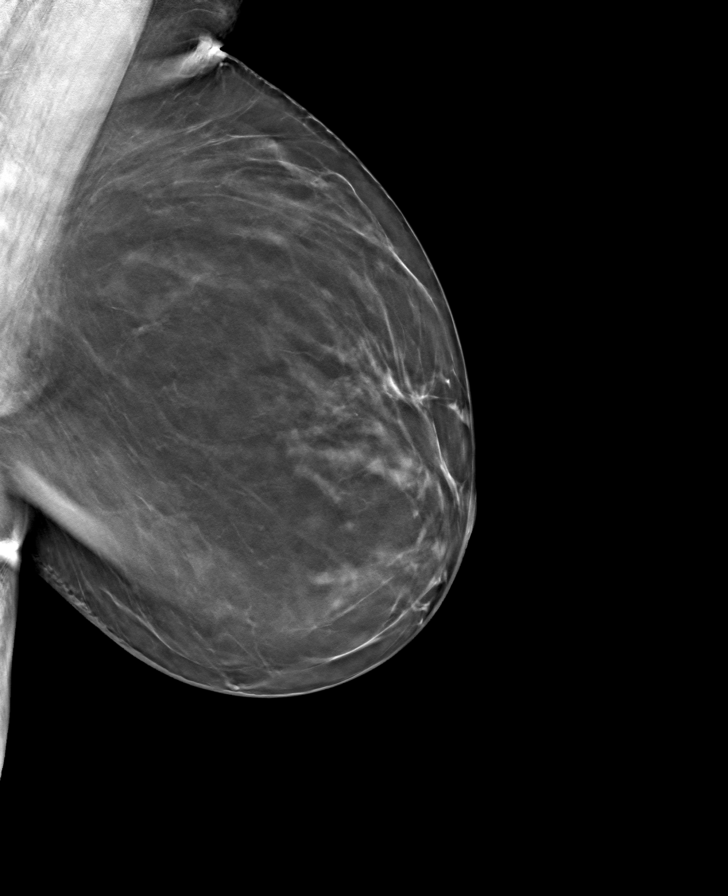

[L CC tomo · tomo slice 35/68.0]
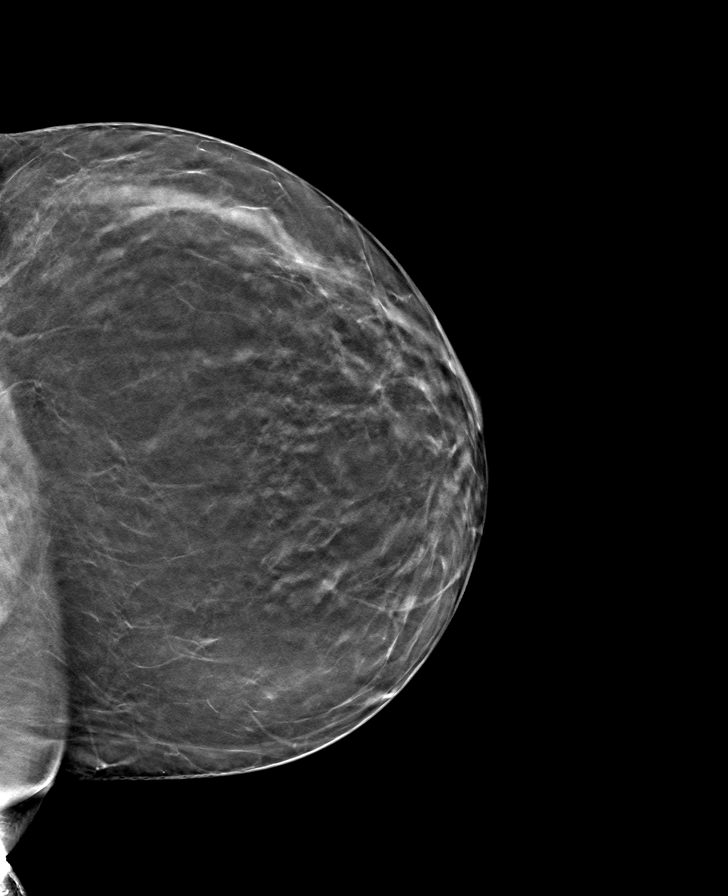

[R MLO tomo · tomo slice 39/77.0]
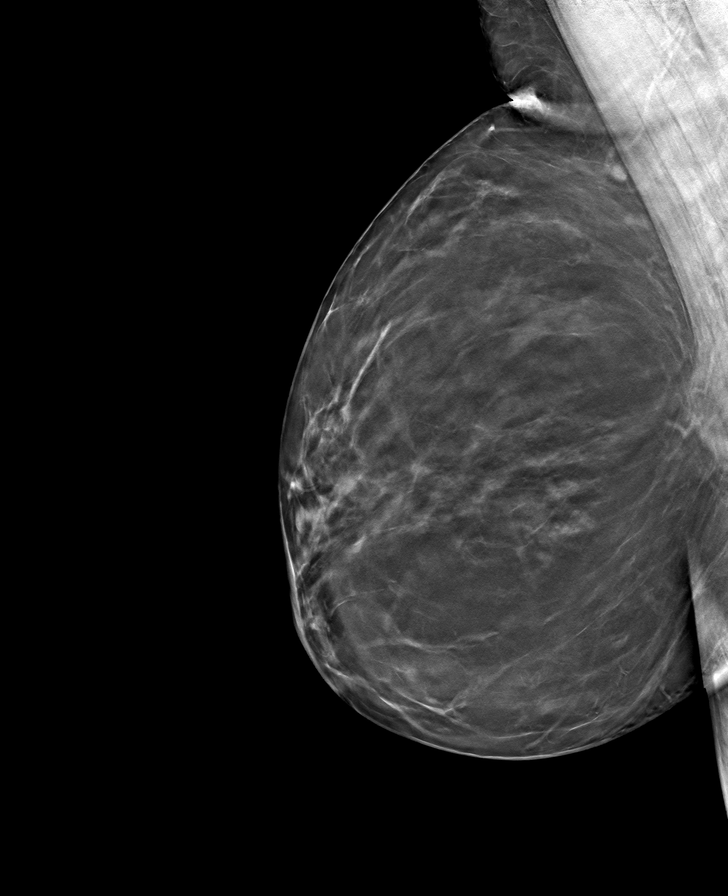

[R CC tomo · tomo slice 35/69.0]
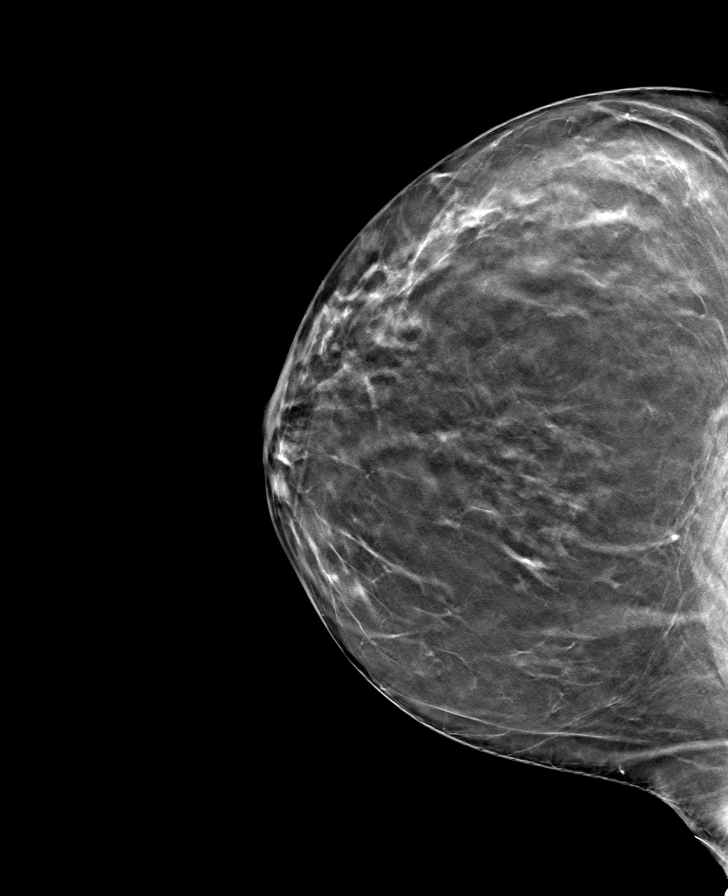

[8 of 24 positions shown; findings below may reference images not displayed]

ACR Breast Density Category b: There are scattered areas of
fibroglandular density.
FINDINGS: There are no findings suspicious for malignancy. Images were
processed with CAD.
IMPRESSION: No mammographic evidence of malignancy. A result letter of this
screening mammogram will be mailed directly to the patient.

RECOMMENDATION:
Screening mammogram in one year. (Code:[id number])

BI-RADS CATEGORY  1: Negative.

## 2020-01-12 ENCOUNTER — Ambulatory Visit: Payer: BC Managed Care – PPO

## 2022-02-09 ENCOUNTER — Other Ambulatory Visit: Payer: Self-pay | Admitting: Physician Assistant

## 2022-02-09 DIAGNOSIS — Z1231 Encounter for screening mammogram for malignant neoplasm of breast: Secondary | ICD-10-CM

## 2022-02-10 ENCOUNTER — Ambulatory Visit
Admission: RE | Admit: 2022-02-10 | Discharge: 2022-02-10 | Disposition: A | Discharge status: Home / Self Care | Payer: BC Managed Care – PPO | Source: Ambulatory Visit | Attending: Physician Assistant | Admitting: Physician Assistant

## 2022-02-10 DIAGNOSIS — Z1231 Encounter for screening mammogram for malignant neoplasm of breast: Secondary | ICD-10-CM

## 2022-11-21 ENCOUNTER — Ambulatory Visit: Payer: BC Managed Care – PPO | Admitting: Podiatry

## 2022-11-21 DIAGNOSIS — L603 Nail dystrophy: Secondary | ICD-10-CM | POA: Diagnosis not present

## 2022-11-21 NOTE — Progress Notes (Signed)
   Chief Complaint  Patient presents with   Nail Problem    BILAT HALLUX NAILS ARE CRACKED ACROSS THE NAIL AND BOTH ARE DISCOLORED   HPI: 47 y.o. female presents today with concern of cracked hallux toenails.  Denies injury and does not recall wearing shoes that were too tight or too small.  States has been going on for couple months.  As the nail is growing out the area where the cracks are located is starting to chip and she is getting concerned.  She states it looks like there is a double nail forming at this point.  Past Medical History:  Diagnosis Date   Anemia    Anxiety    Depression    Past Surgical History:  Procedure Laterality Date   TUBAL LIGATION     Allergies  Allergen Reactions   Sulfa Antibiotics Anaphylaxis    Physical Exam: General: The patient is alert and oriented x3 in no acute distress.  Dermatology: Skin is warm, dry and supple bilateral lower extremities. Interspaces are clear of maceration and debris.  There is a horizontal ridge on bilateral hallux nails approximately 75% distal to the eponychium.  There is minimal to no discomfort on palpation of the nail.  No surrounding erythema is noted.  No subungual ecchymosis is seen.  The distal portion of the nail does have some lysis.  The newer nail portion is well adhered to the nailbed and appears healthy.  Vascular: Palpable pedal pulses bilaterally. Capillary refill within normal limits.  No appreciable edema.  No erythema or calor.  Assessment/Plan of Care: 1. Nail dystrophy     Discussed clinical findings with patient today.  The hallux nails were debrided with sterile nail nippers and a power debriding bur to smooth out the horizontal ridge and help allow the new underlying nail to continue growing out.  It was recommended that she use either white vinegar on a cottonball or perform white vinegar soaks, or use tea tree oil on the toenails as they continue to grow out, as a mild astringent and mild  antifungal.  Patient was informed that after any type of contusion or injury to the nail, or lifting of the nails, this can allow the opportunity for fungal organisms to get underneath the nail and start replicating.  Clerance Lav, DPM, FACFAS [facility]     [address] [place], [place] [address]                Office [phone]  Fax [phone]

## 2022-12-15 ENCOUNTER — Other Ambulatory Visit: Payer: Self-pay | Admitting: Medical Genetics

## 2022-12-15 DIAGNOSIS — Z006 Encounter for examination for normal comparison and control in clinical research program: Secondary | ICD-10-CM

## 2023-01-02 ENCOUNTER — Other Ambulatory Visit (HOSPITAL_COMMUNITY): Payer: BC Managed Care – PPO

## 2023-01-30 ENCOUNTER — Other Ambulatory Visit: Payer: Self-pay | Admitting: Physician Assistant

## 2023-01-30 DIAGNOSIS — Z1231 Encounter for screening mammogram for malignant neoplasm of breast: Secondary | ICD-10-CM

## 2023-02-27 ENCOUNTER — Ambulatory Visit: Payer: BC Managed Care – PPO

## 2023-05-07 ENCOUNTER — Ambulatory Visit
Admission: RE | Admit: 2023-05-07 | Discharge: 2023-05-07 | Disposition: A | Discharge status: Home / Self Care | Payer: BC Managed Care – PPO | Source: Ambulatory Visit | Attending: Physician Assistant | Admitting: Physician Assistant

## 2023-05-07 DIAGNOSIS — Z1231 Encounter for screening mammogram for malignant neoplasm of breast: Secondary | ICD-10-CM

## 2023-05-08 ENCOUNTER — Other Ambulatory Visit: Payer: Self-pay | Admitting: Physician Assistant

## 2023-05-08 DIAGNOSIS — R928 Other abnormal and inconclusive findings on diagnostic imaging of breast: Secondary | ICD-10-CM

## 2023-05-22 ENCOUNTER — Ambulatory Visit
Admission: RE | Admit: 2023-05-22 | Discharge: 2023-05-22 | Disposition: A | Discharge status: Home / Self Care | Source: Ambulatory Visit | Attending: Physician Assistant | Admitting: Physician Assistant

## 2023-05-22 DIAGNOSIS — R928 Other abnormal and inconclusive findings on diagnostic imaging of breast: Secondary | ICD-10-CM

## 2023-05-24 ENCOUNTER — Other Ambulatory Visit: Payer: Self-pay | Admitting: Physician Assistant

## 2023-05-24 DIAGNOSIS — N6489 Other specified disorders of breast: Secondary | ICD-10-CM

## 2023-11-22 ENCOUNTER — Encounter

## 2024-04-17 ENCOUNTER — Encounter
